# Patient Record
Sex: Male | Born: 1990 | Race: White | Hispanic: No | Marital: Single | Smoking: Current every day smoker
Health system: Southern US, Community
[De-identification: ages and names within clinical notes are randomized; demographics above are authoritative.]

## PROBLEM LIST (undated history)

## (undated) DIAGNOSIS — F1910 Other psychoactive substance abuse, uncomplicated: Secondary | ICD-10-CM

## (undated) DIAGNOSIS — H44009 Unspecified purulent endophthalmitis, unspecified eye: Secondary | ICD-10-CM

## (undated) DIAGNOSIS — K7460 Unspecified cirrhosis of liver: Secondary | ICD-10-CM

## (undated) DIAGNOSIS — K219 Gastro-esophageal reflux disease without esophagitis: Secondary | ICD-10-CM

## (undated) DIAGNOSIS — B1920 Unspecified viral hepatitis C without hepatic coma: Secondary | ICD-10-CM

## (undated) HISTORY — DX: Other psychoactive substance abuse, uncomplicated: F19.10

## (undated) HISTORY — DX: Gastro-esophageal reflux disease without esophagitis: K21.9

## (undated) HISTORY — DX: Unspecified purulent endophthalmitis, unspecified eye: H44.009

## (undated) HISTORY — DX: Unspecified viral hepatitis C without hepatic coma: B19.20

## (undated) HISTORY — DX: Unspecified cirrhosis of liver: K74.60

---

## 2018-05-31 DIAGNOSIS — K769 Liver disease, unspecified: Secondary | ICD-10-CM | POA: Diagnosis not present

## 2018-05-31 DIAGNOSIS — D649 Anemia, unspecified: Secondary | ICD-10-CM | POA: Diagnosis not present

## 2018-05-31 DIAGNOSIS — R040 Epistaxis: Secondary | ICD-10-CM | POA: Diagnosis not present

## 2018-05-31 DIAGNOSIS — R600 Localized edema: Secondary | ICD-10-CM | POA: Diagnosis not present

## 2018-05-31 DIAGNOSIS — R188 Other ascites: Secondary | ICD-10-CM | POA: Diagnosis not present

## 2018-06-15 DIAGNOSIS — M25562 Pain in left knee: Secondary | ICD-10-CM | POA: Diagnosis not present

## 2018-06-15 DIAGNOSIS — M25362 Other instability, left knee: Secondary | ICD-10-CM | POA: Diagnosis not present

## 2019-01-18 DIAGNOSIS — K219 Gastro-esophageal reflux disease without esophagitis: Secondary | ICD-10-CM | POA: Diagnosis not present

## 2019-01-18 DIAGNOSIS — K769 Liver disease, unspecified: Secondary | ICD-10-CM | POA: Diagnosis not present

## 2019-01-18 DIAGNOSIS — B182 Chronic viral hepatitis C: Secondary | ICD-10-CM | POA: Diagnosis not present

## 2019-01-18 DIAGNOSIS — Z6824 Body mass index (BMI) 24.0-24.9, adult: Secondary | ICD-10-CM | POA: Diagnosis not present

## 2019-01-26 HISTORY — PX: EYE SURGERY: SHX253

## 2019-01-27 DIAGNOSIS — H4389 Other disorders of vitreous body: Secondary | ICD-10-CM | POA: Diagnosis not present

## 2019-01-27 DIAGNOSIS — Z79899 Other long term (current) drug therapy: Secondary | ICD-10-CM | POA: Diagnosis not present

## 2019-01-27 DIAGNOSIS — H44002 Unspecified purulent endophthalmitis, left eye: Secondary | ICD-10-CM | POA: Diagnosis not present

## 2019-01-27 DIAGNOSIS — F17210 Nicotine dependence, cigarettes, uncomplicated: Secondary | ICD-10-CM | POA: Diagnosis not present

## 2019-01-27 DIAGNOSIS — F1123 Opioid dependence with withdrawal: Secondary | ICD-10-CM | POA: Diagnosis not present

## 2019-01-27 DIAGNOSIS — F1020 Alcohol dependence, uncomplicated: Secondary | ICD-10-CM | POA: Diagnosis not present

## 2019-01-27 DIAGNOSIS — H35352 Cystoid macular degeneration, left eye: Secondary | ICD-10-CM | POA: Diagnosis not present

## 2019-01-27 DIAGNOSIS — H5462 Unqualified visual loss, left eye, normal vision right eye: Secondary | ICD-10-CM | POA: Diagnosis not present

## 2019-01-27 DIAGNOSIS — J45909 Unspecified asthma, uncomplicated: Secondary | ICD-10-CM | POA: Diagnosis not present

## 2019-01-27 DIAGNOSIS — B182 Chronic viral hepatitis C: Secondary | ICD-10-CM | POA: Diagnosis not present

## 2019-01-27 DIAGNOSIS — Z8614 Personal history of Methicillin resistant Staphylococcus aureus infection: Secondary | ICD-10-CM | POA: Diagnosis not present

## 2019-01-27 DIAGNOSIS — F1990 Other psychoactive substance use, unspecified, uncomplicated: Secondary | ICD-10-CM | POA: Diagnosis not present

## 2019-01-27 DIAGNOSIS — K7030 Alcoholic cirrhosis of liver without ascites: Secondary | ICD-10-CM | POA: Diagnosis not present

## 2019-01-27 DIAGNOSIS — B1920 Unspecified viral hepatitis C without hepatic coma: Secondary | ICD-10-CM | POA: Diagnosis not present

## 2019-01-27 DIAGNOSIS — K219 Gastro-esophageal reflux disease without esophagitis: Secondary | ICD-10-CM | POA: Diagnosis not present

## 2019-01-27 DIAGNOSIS — F19130 Other psychoactive substance abuse with withdrawal, uncomplicated: Secondary | ICD-10-CM | POA: Diagnosis not present

## 2019-01-27 DIAGNOSIS — F419 Anxiety disorder, unspecified: Secondary | ICD-10-CM | POA: Diagnosis not present

## 2019-01-27 DIAGNOSIS — H4419 Other endophthalmitis: Secondary | ICD-10-CM | POA: Diagnosis not present

## 2019-01-27 DIAGNOSIS — H538 Other visual disturbances: Secondary | ICD-10-CM | POA: Diagnosis not present

## 2019-01-27 DIAGNOSIS — F1910 Other psychoactive substance abuse, uncomplicated: Secondary | ICD-10-CM | POA: Diagnosis not present

## 2019-01-28 DIAGNOSIS — F17210 Nicotine dependence, cigarettes, uncomplicated: Secondary | ICD-10-CM | POA: Diagnosis not present

## 2019-01-28 DIAGNOSIS — K7030 Alcoholic cirrhosis of liver without ascites: Secondary | ICD-10-CM | POA: Diagnosis not present

## 2019-01-28 DIAGNOSIS — K219 Gastro-esophageal reflux disease without esophagitis: Secondary | ICD-10-CM | POA: Diagnosis not present

## 2019-01-28 DIAGNOSIS — B1920 Unspecified viral hepatitis C without hepatic coma: Secondary | ICD-10-CM | POA: Diagnosis not present

## 2019-01-28 DIAGNOSIS — F1021 Alcohol dependence, in remission: Secondary | ICD-10-CM | POA: Diagnosis not present

## 2019-01-28 DIAGNOSIS — F1110 Opioid abuse, uncomplicated: Secondary | ICD-10-CM | POA: Diagnosis not present

## 2019-01-28 DIAGNOSIS — F419 Anxiety disorder, unspecified: Secondary | ICD-10-CM | POA: Diagnosis not present

## 2019-01-28 DIAGNOSIS — F1120 Opioid dependence, uncomplicated: Secondary | ICD-10-CM | POA: Diagnosis not present

## 2019-01-28 DIAGNOSIS — H44002 Unspecified purulent endophthalmitis, left eye: Secondary | ICD-10-CM | POA: Diagnosis not present

## 2019-01-28 DIAGNOSIS — B179 Acute viral hepatitis, unspecified: Secondary | ICD-10-CM | POA: Diagnosis not present

## 2019-02-03 DIAGNOSIS — Z6823 Body mass index (BMI) 23.0-23.9, adult: Secondary | ICD-10-CM | POA: Diagnosis not present

## 2019-02-03 DIAGNOSIS — H4419 Other endophthalmitis: Secondary | ICD-10-CM | POA: Diagnosis not present

## 2019-02-03 DIAGNOSIS — Z792 Long term (current) use of antibiotics: Secondary | ICD-10-CM | POA: Diagnosis not present

## 2019-02-07 DIAGNOSIS — Z792 Long term (current) use of antibiotics: Secondary | ICD-10-CM | POA: Diagnosis not present

## 2019-02-10 DIAGNOSIS — B3789 Other sites of candidiasis: Secondary | ICD-10-CM | POA: Diagnosis not present

## 2019-02-10 DIAGNOSIS — T3695XA Adverse effect of unspecified systemic antibiotic, initial encounter: Secondary | ICD-10-CM | POA: Diagnosis not present

## 2019-02-10 DIAGNOSIS — R0602 Shortness of breath: Secondary | ICD-10-CM | POA: Diagnosis not present

## 2019-02-10 DIAGNOSIS — E876 Hypokalemia: Secondary | ICD-10-CM | POA: Diagnosis not present

## 2019-02-10 DIAGNOSIS — H4419 Other endophthalmitis: Secondary | ICD-10-CM | POA: Diagnosis not present

## 2019-02-10 DIAGNOSIS — F1020 Alcohol dependence, uncomplicated: Secondary | ICD-10-CM | POA: Diagnosis not present

## 2019-02-10 DIAGNOSIS — B49 Unspecified mycosis: Secondary | ICD-10-CM | POA: Diagnosis not present

## 2019-02-10 DIAGNOSIS — Z1639 Resistance to other specified antimicrobial drug: Secondary | ICD-10-CM | POA: Diagnosis not present

## 2019-02-10 DIAGNOSIS — Z5329 Procedure and treatment not carried out because of patient's decision for other reasons: Secondary | ICD-10-CM | POA: Diagnosis not present

## 2019-02-10 DIAGNOSIS — F1123 Opioid dependence with withdrawal: Secondary | ICD-10-CM | POA: Diagnosis not present

## 2019-02-10 DIAGNOSIS — F419 Anxiety disorder, unspecified: Secondary | ICD-10-CM | POA: Diagnosis not present

## 2019-02-10 DIAGNOSIS — R0600 Dyspnea, unspecified: Secondary | ICD-10-CM | POA: Diagnosis not present

## 2019-02-10 DIAGNOSIS — B179 Acute viral hepatitis, unspecified: Secondary | ICD-10-CM | POA: Diagnosis not present

## 2019-02-10 DIAGNOSIS — N179 Acute kidney failure, unspecified: Secondary | ICD-10-CM | POA: Diagnosis not present

## 2019-02-10 DIAGNOSIS — B182 Chronic viral hepatitis C: Secondary | ICD-10-CM | POA: Diagnosis not present

## 2019-02-10 DIAGNOSIS — B379 Candidiasis, unspecified: Secondary | ICD-10-CM | POA: Diagnosis not present

## 2019-02-10 DIAGNOSIS — F1910 Other psychoactive substance abuse, uncomplicated: Secondary | ICD-10-CM | POA: Diagnosis not present

## 2019-02-10 DIAGNOSIS — F13239 Sedative, hypnotic or anxiolytic dependence with withdrawal, unspecified: Secondary | ICD-10-CM | POA: Diagnosis not present

## 2019-02-10 DIAGNOSIS — F17210 Nicotine dependence, cigarettes, uncomplicated: Secondary | ICD-10-CM | POA: Diagnosis not present

## 2019-02-10 DIAGNOSIS — H5789 Other specified disorders of eye and adnexa: Secondary | ICD-10-CM | POA: Diagnosis not present

## 2019-02-10 DIAGNOSIS — H44002 Unspecified purulent endophthalmitis, left eye: Secondary | ICD-10-CM | POA: Diagnosis not present

## 2019-02-10 DIAGNOSIS — E871 Hypo-osmolality and hyponatremia: Secondary | ICD-10-CM | POA: Diagnosis not present

## 2019-02-10 DIAGNOSIS — K219 Gastro-esophageal reflux disease without esophagitis: Secondary | ICD-10-CM | POA: Diagnosis not present

## 2019-02-10 DIAGNOSIS — K7030 Alcoholic cirrhosis of liver without ascites: Secondary | ICD-10-CM | POA: Diagnosis not present

## 2019-07-04 ENCOUNTER — Encounter: Payer: Self-pay | Admitting: Family

## 2019-07-10 ENCOUNTER — Other Ambulatory Visit: Payer: Self-pay

## 2019-07-10 ENCOUNTER — Encounter: Payer: BC Managed Care – PPO | Attending: Physician Assistant | Admitting: Physician Assistant

## 2019-07-10 DIAGNOSIS — K7460 Unspecified cirrhosis of liver: Secondary | ICD-10-CM | POA: Diagnosis not present

## 2019-07-10 DIAGNOSIS — T22212A Burn of second degree of left forearm, initial encounter: Secondary | ICD-10-CM | POA: Insufficient documentation

## 2019-07-10 DIAGNOSIS — B182 Chronic viral hepatitis C: Secondary | ICD-10-CM | POA: Diagnosis not present

## 2019-07-10 DIAGNOSIS — X58XXXA Exposure to other specified factors, initial encounter: Secondary | ICD-10-CM | POA: Insufficient documentation
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
## 2019-07-17 ENCOUNTER — Encounter: Payer: BC Managed Care – PPO | Admitting: Physician Assistant

## 2019-07-17 ENCOUNTER — Other Ambulatory Visit: Payer: Self-pay

## 2019-07-17 DIAGNOSIS — K7460 Unspecified cirrhosis of liver: Secondary | ICD-10-CM | POA: Diagnosis not present

## 2019-07-17 DIAGNOSIS — T22212A Burn of second degree of left forearm, initial encounter: Secondary | ICD-10-CM | POA: Diagnosis not present

## 2019-07-17 DIAGNOSIS — B182 Chronic viral hepatitis C: Secondary | ICD-10-CM | POA: Diagnosis not present

## 2019-07-17 DIAGNOSIS — X58XXXA Exposure to other specified factors, initial encounter: Secondary | ICD-10-CM | POA: Diagnosis not present
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
## 2019-07-24 ENCOUNTER — Encounter: Payer: BC Managed Care – PPO | Admitting: Physician Assistant

## 2019-07-24 ENCOUNTER — Other Ambulatory Visit: Payer: Self-pay

## 2019-07-24 DIAGNOSIS — X58XXXA Exposure to other specified factors, initial encounter: Secondary | ICD-10-CM | POA: Diagnosis not present

## 2019-07-24 DIAGNOSIS — K7460 Unspecified cirrhosis of liver: Secondary | ICD-10-CM | POA: Diagnosis not present

## 2019-07-24 DIAGNOSIS — T22212A Burn of second degree of left forearm, initial encounter: Secondary | ICD-10-CM | POA: Diagnosis not present

## 2019-07-24 DIAGNOSIS — B182 Chronic viral hepatitis C: Secondary | ICD-10-CM | POA: Diagnosis not present
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
## 2019-07-31 ENCOUNTER — Ambulatory Visit: Payer: BC Managed Care – PPO | Admitting: Physician Assistant

## 2019-08-17 ENCOUNTER — Telehealth: Payer: Self-pay | Admitting: Pharmacy Technician

## 2019-08-17 NOTE — Telephone Encounter (Signed)
[facility] Encounter  Insurance verification completed.    The patient is insured through [employer].  We will continue to follow to see if copay assistance is needed.  Daylynn Stumpp E. Chameka Mcmullen, CPhT [facility] Phone: [phone] Fax:  [phone]   

## 2019-08-23 ENCOUNTER — Encounter: Payer: Self-pay | Admitting: Family

## 2019-08-23 ENCOUNTER — Ambulatory Visit (INDEPENDENT_AMBULATORY_CARE_PROVIDER_SITE_OTHER): Payer: BC Managed Care – PPO | Admitting: Family

## 2019-08-23 ENCOUNTER — Other Ambulatory Visit: Payer: Self-pay

## 2019-08-23 VITALS — BP 148/78 | HR 96 | Temp 98.3°F | Ht 72.0 in | Wt 209.0 lb

## 2019-08-23 DIAGNOSIS — K7030 Alcoholic cirrhosis of liver without ascites: Secondary | ICD-10-CM | POA: Diagnosis not present

## 2019-08-23 DIAGNOSIS — B182 Chronic viral hepatitis C: Secondary | ICD-10-CM | POA: Diagnosis not present

## 2019-08-23 NOTE — Assessment & Plan Note (Signed)
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  Most recent RNA level of 289,000.  HIV and hepatitis B testing were negative.  He is currently asymptomatic and treatment nave.  We discussed the pathogenesis, risks of left untreated, transmission, prevention, and treatment options for hepatitis C.  Check hepatitis C genotype and liver function tests today.  He does have history of cirrhosis and will obtain elastography. He met with pharmacy staff today for financial assistance paperwork. Plan for follow-up 1 month after initiation of medication likely to be [place].

## 2019-08-23 NOTE — Patient Instructions (Signed)
Nice to meet you.  We will check your blood work today and call you with the results.  Plan for follow up 1 month after starting the medication.   Recommend establishing with a Gastroenterologist for follow up.   Limit acetaminophen (Tylenol) usage to no more than 2 grams (2,000 mg) per day.  Avoid alcohol.  Do not share toothbrushes or razors.  Practice safe sex to protect against transmission as well as sexually transmitted disease.    Hepatitis C Hepatitis C is a viral infection of the liver. It can lead to scarring of the liver (cirrhosis), liver failure, or liver cancer. Hepatitis C may go undetected for months or years because people with the infection may not have symptoms, or they may have only mild symptoms. What are the causes? This condition is caused by the hepatitis C virus (HCV). The virus can spread from person to person (is contagious) through:  Blood.  Childbirth. A woman who has hepatitis C can pass it to her baby during birth.  Bodily fluids, such as breast milk, tears, semen, vaginal fluids, and saliva.  Blood transfusions or organ transplants done in the [place] before 1992.  What increases the risk? The following factors may make you more likely to develop this condition:  Having contact with unclean (contaminated) needles or syringes. This may result from: ? Acupuncture. ? Tattoing. ? Body piercing. ? Injecting drugs.  Having unprotected sex with someone who is infected.  Needing treatment to filter your blood (kidney dialysis).  Having HIV (human immunodeficiency virus) or AIDS (acquired immunodeficiency syndrome).  Working in a job that involves contact with blood or bodily fluids, such as health care.  What are the signs or symptoms? Symptoms of this condition include:  Fatigue.  Loss of appetite.  Nausea.  Vomiting.  Abdominal pain.  Dark yellow urine.  Yellowish skin and eyes (jaundice).  Itchy skin.  Clay-colored  bowel movements.  Joint pain.  Bleeding and bruising easily.  Fluid building up in your stomach (ascites).  In some cases, you may not have any symptoms. How is this diagnosed? This condition is diagnosed with:  Blood tests.  Other tests to check how well your liver is functioning. They may include: ? Magnetic resonance elastography (MRE). This imaging test uses MRIs and sound waves to measure liver stiffness. ? Transient elastography. This imaging test uses ultrasounds to measure liver stiffness. ? Liver biopsy. This test requires taking a small tissue sample from your liver to examine it under a microscope.  How is this treated? Your health care provider may perform noninvasive tests or a liver biopsy to help decide the best course of treatment. Treatment may include:  Antiviral medicines and other medicines.  Follow-up treatments every 6-12 months for infections or other liver conditions.  Receiving a donated liver (liver transplant).  Follow these instructions at home: Medicines  Take over-the-counter and prescription medicines only as told by your health care provider.  Take your antiviral medicine as told by your health care provider. Do not stop taking the antiviral even if you start to feel better.  Do not take any medicines unless approved by your health care provider, including over-the-counter medicines and birth control pills. Activity  Rest as needed.  Do not have sex unless approved by your health care provider.  Ask your health care provider when you may return to school or work. Eating and drinking  Eat a balanced diet with plenty of fruits and vegetables, whole grains, and lowfat (lean)  meats or non-meat proteins (such as beans or tofu).  Drink enough fluids to keep your urine clear or pale yellow.  Do not drink alcohol. General instructions  Do not share toothbrushes, nail clippers, or razors.  Wash your hands frequently with soap and water. If  soap and water are not available, use hand sanitizer.  Cover any cuts or open sores on your skin to prevent spreading the virus.  Keep all follow-up visits as told by your health care provider. This is important. You may need follow-up visits every 6-12 months. How is this prevented? There is no vaccine for hepatitis C. The only way to prevent the disease is to reduce the risk of exposure to the virus. Make sure you:  Wash your hands frequently with soap and water. If soap and water are not available, use hand sanitizer.  Do not share needles or syringes.  Practice safe sex and use condoms.  Avoid handling blood or bodily fluids without gloves or other protection.  Avoid getting tattoos or piercings in shops or other locations that are not clean.  Contact a health care provider if:  You have a fever.  You develop abdominal pain.  You pass dark urine.  You pass clay-colored stools.  You develop joint pain. Get help right away if:  You have increasing fatigue or weakness.  You lose your appetite.  You cannot eat or drink without vomiting.  You develop jaundice or your jaundice gets worse.  You bruise or bleed easily. Summary  Hepatitis C is a viral infection of the liver. It can lead to scarring of the liver (cirrhosis), liver failure, or liver cancer.  The hepatitis C virus (HCV) causes this condition. The virus can pass from person to person (is contagious).  You should not take any medicines unless approved by your health care provider. This includes over-the-counter medicines and birth control pills. This information is not intended to replace advice given to you by your health care provider. Make sure you discuss any questions you have with your health care provider. Document Released: 04/10/2000 Document Revised: 05/19/2016 Document Reviewed: 05/19/2016 Elsevier Interactive Patient Education  [employer].

## 2019-08-23 NOTE — Progress Notes (Signed)
Subjective:    Patient ID: [name] [name], male    DOB: 1991-02-08, 29 y.o.   MRN: [id number]  Chief Complaint  Patient presents with  . New Patient (Initial Visit)  . Hepatitis C    HPI:  [name] [name] is a 29 y.o. male with previous medical history of endophthalmitis, gastroesophageal reflux, cirrhosis, alcohol abuse, history of intravenous drug use and hepatitis C presenting today for history initial treatment and evaluation of hepatitis C.  [name] [name] was referred from [facility] where is currently in treatment.  Blood work completed on 07/04/2019 with positive Hepatitis C antibody, RNA level of 289,000, Platlet count of 125, and liver function tests with AST 70 and ALT 89. HIV and Hepatitis B testing was negative.   [name] [name] was first diagnosed with hepatitis C in 2012 when he was seeing an addiction specialist for Suboxone.  Risk factors for acquiring hepatitis C include IV drug use and tattoos.  Denies history of blood transfusions, sharing of toothbrushes/razors, or sexual contact with known positive partners.  Personal history of cirrhosis as well as family history of cirrhosis secondary to alcohol and hepatitis C in father.  Currently asymptomatic with no abdominal pain, nausea, vomiting, scleral icterus, or jaundice.  He has not been treated for hepatitis C in the past.  No current recreational or illicit drug use or alcohol consumption.  He smokes approximately 1 pack of cigarettes per day on average.  Continues to be at rehabilitation with [facility].    No Known Allergies    Outpatient Medications Prior to Visit  Medication Sig Dispense Refill  . albuterol (VENTOLIN HFA) 108 (90 Base) MCG/ACT inhaler     . divalproex (DEPAKOTE ER) 500 MG 24 hr tablet Take 500 mg by mouth 2 (two) times daily.    . Multiple Vitamins-Minerals (MENS MULTIVITAMIN) TABS Take by mouth.    . pantoprazole (PROTONIX) 40 MG tablet Take 40 mg by mouth daily.    . QUEtiapine (SEROQUEL) 50 MG  tablet SMARTSIG:1 Tablet(s) By Mouth Every Evening PRN     No facility-administered medications prior to visit.     Past Medical History:  Diagnosis Date  . Cirrhosis ([place])   . Endophthalmitis   . GERD (gastroesophageal reflux disease)   . Hepatitis C   . Substance abuse [facility])       Past Surgical History:  Procedure Laterality Date  . EYE SURGERY Left 01/26/2019      Family History  Problem Relation Age of Onset  . Diabetes Father       Social History   Socioeconomic History  . Marital status: Single    Spouse name: Not on file  . Number of children: Not on file  . Years of education: Not on file  . Highest education level: Not on file  Occupational History  . Not on file  Tobacco Use  . Smoking status: Current Every Day Smoker    Packs/day: 1.00    Types: Cigarettes    Start date: 08/22/2005  . Smokeless tobacco: Never Used  . Tobacco comment: cut back from 2 packs a day  Substance and Sexual Activity  . Alcohol use: Not Currently    Comment: none since 2020  . Drug use: Not Currently    Types: IV, Heroin, Marijuana, Cocaine, Barbituates, Benzodiazepines, "Crack" cocaine, Codeine, Fentanyl, Hashish, Hydrocodone, Hydromorphone, Ketamine, LSD, MDMA (Ecstacy), Morphine, Nitrous oxide, Opium, Oxycodone, Psilocybin, Solvent inhalants    Comment: started at 14; clean since 06/25/19, in [employer]  .  Sexual activity: Not Currently    Partners: Female    Comment: declined condoms  Other Topics Concern  . Not on file  Social History Narrative  . Not on file   Social Determinants of Health   Financial Resource Strain:   . Difficulty of Paying Living Expenses:   Food Insecurity:   . Worried About Charity fundraiser in the Last Year:   . Arboriculturist in the Last Year:   Transportation Needs:   . Film/video editor (Medical):   Marland Kitchen Lack of Transportation (Non-Medical):   Physical Activity:   . Days of Exercise per Week:   . Minutes of Exercise  per Session:   Stress:   . Feeling of Stress :   Social Connections:   . Frequency of Communication with Friends and Family:   . Frequency of Social Gatherings with Friends and Family:   . Attends Religious Services:   . Active Member of Clubs or Organizations:   . Attends Archivist Meetings:   Marland Kitchen Marital Status:   Intimate Partner Violence:   . Fear of Current or Ex-Partner:   . Emotionally Abused:   Marland Kitchen Physically Abused:   . Sexually Abused:       Review of Systems  Constitutional: Negative for chills, diaphoresis, fatigue and fever.  Respiratory: Negative for cough, chest tightness, shortness of breath and wheezing.   Cardiovascular: Negative for chest pain.  Gastrointestinal: Negative for abdominal distention, abdominal pain, constipation, diarrhea, nausea and vomiting.  Neurological: Negative for weakness and headaches.  Hematological: Does not bruise/bleed easily.       Objective:    BP (!) 148/78   Pulse 96   Temp 98.3 F (36.8 C) (Oral)   Ht 6' (1.829 m)   Wt 209 lb (94.8 kg)   SpO2 98%   BMI 28.35 kg/m  Nursing note and vital signs reviewed.  Physical Exam Constitutional:      General: He is not in acute distress.    Appearance: He is well-developed.  Cardiovascular:     Rate and Rhythm: Normal rate and regular rhythm.     Heart sounds: Normal heart sounds. No murmur. No friction rub. No gallop.   Pulmonary:     Effort: Pulmonary effort is normal. No respiratory distress.     Breath sounds: Normal breath sounds. No wheezing or rales.  Chest:     Chest wall: No tenderness.  Abdominal:     General: Bowel sounds are normal. There is no distension.     Palpations: Abdomen is soft. There is no mass.     Tenderness: There is no abdominal tenderness. There is no guarding or rebound.  Skin:    General: Skin is warm and dry.  Neurological:     Mental Status: He is alert and oriented to person, place, and time.  Psychiatric:        Behavior:  Behavior normal.        Thought Content: Thought content normal.        Judgment: Judgment normal.        Assessment & Plan:   Patient Active Problem List   Diagnosis Date Noted  . Chronic hepatitis C without hepatic coma ([place]) 08/23/2019  . Alcoholic cirrhosis of liver without ascites ([place]) 08/23/2019     Problem List Items Addressed This Visit      Digestive   Chronic hepatitis C without hepatic coma ([place]) - Primary    [name] [name] is a  29 year old male with chronic hepatitis C likely obtained from history of IV drug use.  Most recent RNA level of 289,000.  HIV and hepatitis B testing were negative.  He is currently asymptomatic and treatment nave.  We discussed the pathogenesis, risks of left untreated, transmission, prevention, and treatment options for hepatitis C.  Check hepatitis C genotype and liver function tests today.  He does have history of cirrhosis and will obtain elastography. He met with pharmacy staff today for financial assistance paperwork. Plan for follow-up 1 month after initiation of medication likely to be Mavyret.        Relevant Orders   Hepatic function panel   Hepatitis C genotype   Protime-INR   CBC   US ABDOMEN COMPLETE W/ELASTOGRAPHY   Alcoholic cirrhosis of liver without ascites [facility])    [name] [name] is a 29 year old male with cirrhosis of the liver secondary to alcohol use and chronic hepatitis C.  Appears to be compensated at present we will check liver function tests today and schedule elastography.  Recommended to follow-up with gastroenterology for routine care.  Will need continued follow-up at completion of treatment for hepatitis C.      Relevant Orders   Hepatic function panel   Protime-INR   CBC   US ABDOMEN COMPLETE W/ELASTOGRAPHY       I am having [name] maintain his albuterol, divalproex, Mens Multivitamin, pantoprazole, and QUEtiapine.   Follow-up: Pending blood work results.   Terri Piedra, MSN, FNP-C Nurse  Practitioner [facility] for Infectious Disease [place] number: [phone]

## 2019-08-23 NOTE — Assessment & Plan Note (Signed)
[name] [name] is a 29 year old male with cirrhosis of the liver secondary to alcohol use and chronic hepatitis C.  Appears to be compensated at present we will check liver function tests today and schedule elastography.  Recommended to follow-up with gastroenterology for routine care.  Will need continued follow-up at completion of treatment for hepatitis C.

## 2019-08-29 LAB — CBC
HCT: 38.8 % (ref 38.5–50.0)
Hemoglobin: 13 g/dL — ABNORMAL LOW (ref 13.2–17.1)
MCH: 30 pg (ref 27.0–33.0)
MCHC: 33.5 g/dL (ref 32.0–36.0)
MCV: 89.6 fL (ref 80.0–100.0)
MPV: 11.5 fL (ref 7.5–12.5)
Platelets: 113 10*3/uL — ABNORMAL LOW (ref 140–400)
RBC: 4.33 10*6/uL (ref 4.20–5.80)
RDW: 13.1 % (ref 11.0–15.0)
WBC: 7.7 10*3/uL (ref 3.8–10.8)

## 2019-08-29 LAB — HEPATIC FUNCTION PANEL
AG Ratio: 1.6 (calc) (ref 1.0–2.5)
ALT: 77 U/L — ABNORMAL HIGH (ref 9–46)
AST: 42 U/L — ABNORMAL HIGH (ref 10–40)
Albumin: 4.7 g/dL (ref 3.6–5.1)
Alkaline phosphatase (APISO): 76 U/L (ref 36–130)
Bilirubin, Direct: 0.1 mg/dL (ref 0.0–0.2)
Globulin: 2.9 g/dL (calc) (ref 1.9–3.7)
Indirect Bilirubin: 0.3 mg/dL (calc) (ref 0.2–1.2)
Total Bilirubin: 0.4 mg/dL (ref 0.2–1.2)
Total Protein: 7.6 g/dL (ref 6.1–8.1)

## 2019-08-29 LAB — PROTIME-INR
INR: 1.1
Prothrombin Time: 11.3 s (ref 9.0–11.5)

## 2019-08-29 LAB — HEPATITIS C GENOTYPE

## 2019-08-30 ENCOUNTER — Other Ambulatory Visit: Payer: Self-pay

## 2019-08-30 ENCOUNTER — Ambulatory Visit (HOSPITAL_COMMUNITY)
Admission: RE | Admit: 2019-08-30 | Discharge: 2019-08-30 | Disposition: A | Discharge status: Home / Self Care | Payer: BC Managed Care – PPO | Source: Ambulatory Visit | Attending: Family | Admitting: Family

## 2019-08-30 ENCOUNTER — Telehealth: Payer: Self-pay | Admitting: Family

## 2019-08-30 DIAGNOSIS — K7689 Other specified diseases of liver: Secondary | ICD-10-CM | POA: Diagnosis not present

## 2019-08-30 DIAGNOSIS — B182 Chronic viral hepatitis C: Secondary | ICD-10-CM

## 2019-08-30 DIAGNOSIS — B1920 Unspecified viral hepatitis C without hepatic coma: Secondary | ICD-10-CM | POA: Diagnosis not present

## 2019-08-30 DIAGNOSIS — K7030 Alcoholic cirrhosis of liver without ascites: Secondary | ICD-10-CM | POA: Insufficient documentation

## 2019-08-30 MED ORDER — MAVYRET 100-40 MG PO TABS: 3.0000 | ORAL_TABLET | Freq: Every day | ORAL | 1 refills | Status: AC

## 2019-08-30 NOTE — Telephone Encounter (Signed)
[name] [name] has Genotype 1a chronic hepatitis C with initial viral load of 289,000 and known compensated cirrhosis. Liver ultrasound without concern for hepatocellular carcinoma. Discussed these results with [name] [name] and will plan for 8 weeks of Mavyret. Follow up 1 month after start of medication.

## 2019-08-31 ENCOUNTER — Telehealth: Payer: Self-pay | Admitting: Pharmacy Technician

## 2019-08-31 NOTE — Telephone Encounter (Signed)
[facility] Patient Advocate Encounter   Received notification from [facility] that prior authorization for Mavyret is required.   PA submitted on 08/31/2019 Key [id number] Status is pending [phone]    [facility] will continue to follow.  Beulah Gandy, CPhT Specialty Pharmacy Patient [facility] for Infectious Disease Phone: [phone] Fax: [phone] 08/31/2019 12:08 PM

## 2019-08-31 NOTE — Telephone Encounter (Signed)
Will submit the prior authorization and contact the patient.

## 2019-09-04 NOTE — Telephone Encounter (Signed)
[facility] Patient Advocate Encounter  Prior Review and certification form was filled out and faxed back to [employer] [place]. The form was sent along with baseline hcv rna, office notes, ultrasound and reasons why the patient cannot take the preferred Velpatasvir/Sofosbuvir.   Fax: [phone]  Pending clinical review now that baseline RNA has been submitted

## 2019-09-04 NOTE — Telephone Encounter (Signed)
[facility] Patient Advocate Encounter  Received notification from [employer] that the request for prior authorization for Mavyret has been denied due to not having a required documented HCV RNA baseline. We have it in the office notes but not the lab result version. [facility].     This determination can be appealed and faxed in once we have the required documentation. It was denied on 09/03/2019   This encounter will continue to be updated until final determination.    Beulah Gandy, CPhT Specialty Pharmacy Patient [facility] for Infectious Disease Phone: [phone] Fax: [phone] 09/04/2019 11:45 AM

## 2019-09-11 MED FILL — MAVYRET 100-40 MG TABS: 100-40 | 28 days supply | Qty: 84 | Fill #0

## 2019-09-11 NOTE — Telephone Encounter (Addendum)
[facility] Patient Advocate Encounter  Prior Authorization for Mavyret has been approved.    PA# [id number] Effective dates: 08/31/2019 through 02/27/2020  Patients co-pay is $0.00   [facility] will continue to follow. Left voicemail with the patient to confirm first shipment from [facility]  Beulah Gandy, CPhT Specialty Pharmacy Patient [facility] for Infectious Disease Phone: [phone] Fax: [phone] 09/11/2019 10:05 AM

## 2019-09-12 ENCOUNTER — Encounter: Payer: Self-pay | Admitting: Pharmacy Technician

## 2019-09-12 ENCOUNTER — Telehealth: Payer: Self-pay | Admitting: Pharmacist

## 2019-09-12 NOTE — Telephone Encounter (Signed)
Thanks [name] [name]! 

## 2019-10-13 MED FILL — MAVYRET 100-40 MG TABS: 100-40 | 28 days supply | Qty: 84 | Fill #1

## 2019-10-16 ENCOUNTER — Ambulatory Visit: Payer: BC Managed Care – PPO | Admitting: Pharmacist

## 2019-10-23 MED FILL — MAVYRET 100-40 MG TABS: 100-40 | 28 days supply | Qty: 84 | Fill #1

## 2019-11-13 ENCOUNTER — Ambulatory Visit: Payer: BC Managed Care – PPO | Admitting: Pharmacist

## 2020-01-14 DIAGNOSIS — K2900 Acute gastritis without bleeding: Secondary | ICD-10-CM | POA: Diagnosis not present

## 2020-01-14 DIAGNOSIS — F17200 Nicotine dependence, unspecified, uncomplicated: Secondary | ICD-10-CM | POA: Diagnosis not present

## 2020-01-14 DIAGNOSIS — K7460 Unspecified cirrhosis of liver: Secondary | ICD-10-CM | POA: Diagnosis not present

## 2020-01-14 DIAGNOSIS — K2920 Alcoholic gastritis without bleeding: Secondary | ICD-10-CM | POA: Diagnosis not present

## 2020-01-14 DIAGNOSIS — R1013 Epigastric pain: Secondary | ICD-10-CM | POA: Diagnosis not present

## 2020-03-10 DIAGNOSIS — F10239 Alcohol dependence with withdrawal, unspecified: Secondary | ICD-10-CM | POA: Diagnosis not present

## 2020-03-10 DIAGNOSIS — F17210 Nicotine dependence, cigarettes, uncomplicated: Secondary | ICD-10-CM | POA: Diagnosis not present

## 2020-03-10 DIAGNOSIS — J45909 Unspecified asthma, uncomplicated: Secondary | ICD-10-CM | POA: Diagnosis not present

## 2020-03-10 DIAGNOSIS — R1084 Generalized abdominal pain: Secondary | ICD-10-CM | POA: Diagnosis not present

## 2020-03-10 DIAGNOSIS — K7460 Unspecified cirrhosis of liver: Secondary | ICD-10-CM | POA: Diagnosis not present

## 2020-03-10 DIAGNOSIS — K7030 Alcoholic cirrhosis of liver without ascites: Secondary | ICD-10-CM | POA: Diagnosis not present

## 2020-03-10 DIAGNOSIS — B182 Chronic viral hepatitis C: Secondary | ICD-10-CM | POA: Diagnosis not present

## 2020-03-10 DIAGNOSIS — K7010 Alcoholic hepatitis without ascites: Secondary | ICD-10-CM | POA: Diagnosis not present

## 2020-03-10 DIAGNOSIS — Z8614 Personal history of Methicillin resistant Staphylococcus aureus infection: Secondary | ICD-10-CM | POA: Diagnosis not present

## 2020-03-10 DIAGNOSIS — Z79899 Other long term (current) drug therapy: Secondary | ICD-10-CM | POA: Diagnosis not present

## 2020-03-10 DIAGNOSIS — R109 Unspecified abdominal pain: Secondary | ICD-10-CM | POA: Diagnosis not present

## 2020-03-10 DIAGNOSIS — K219 Gastro-esophageal reflux disease without esophagitis: Secondary | ICD-10-CM | POA: Diagnosis not present

## 2020-03-10 DIAGNOSIS — B179 Acute viral hepatitis, unspecified: Secondary | ICD-10-CM | POA: Diagnosis not present

## 2020-03-10 DIAGNOSIS — K2920 Alcoholic gastritis without bleeding: Secondary | ICD-10-CM | POA: Diagnosis not present

## 2020-03-10 DIAGNOSIS — Z716 Tobacco abuse counseling: Secondary | ICD-10-CM | POA: Diagnosis not present

## 2020-03-26 DIAGNOSIS — F10239 Alcohol dependence with withdrawal, unspecified: Secondary | ICD-10-CM | POA: Diagnosis not present

## 2020-03-26 DIAGNOSIS — F1220 Cannabis dependence, uncomplicated: Secondary | ICD-10-CM | POA: Diagnosis not present

## 2020-03-26 DIAGNOSIS — F419 Anxiety disorder, unspecified: Secondary | ICD-10-CM | POA: Diagnosis not present

## 2020-03-26 DIAGNOSIS — F319 Bipolar disorder, unspecified: Secondary | ICD-10-CM | POA: Diagnosis not present

## 2020-03-26 DIAGNOSIS — F17200 Nicotine dependence, unspecified, uncomplicated: Secondary | ICD-10-CM | POA: Diagnosis not present

## 2020-03-26 DIAGNOSIS — F1020 Alcohol dependence, uncomplicated: Secondary | ICD-10-CM | POA: Diagnosis not present

## 2020-04-09 DIAGNOSIS — F419 Anxiety disorder, unspecified: Secondary | ICD-10-CM | POA: Diagnosis not present

## 2020-04-09 DIAGNOSIS — B182 Chronic viral hepatitis C: Secondary | ICD-10-CM | POA: Diagnosis not present

## 2020-04-09 DIAGNOSIS — K219 Gastro-esophageal reflux disease without esophagitis: Secondary | ICD-10-CM | POA: Diagnosis not present

## 2020-04-09 DIAGNOSIS — K7031 Alcoholic cirrhosis of liver with ascites: Secondary | ICD-10-CM | POA: Diagnosis not present

## 2020-05-01 DIAGNOSIS — B182 Chronic viral hepatitis C: Secondary | ICD-10-CM | POA: Diagnosis not present

## 2020-05-29 DIAGNOSIS — F1020 Alcohol dependence, uncomplicated: Secondary | ICD-10-CM | POA: Diagnosis not present

## 2020-05-29 DIAGNOSIS — F1120 Opioid dependence, uncomplicated: Secondary | ICD-10-CM | POA: Diagnosis not present

## 2020-05-29 DIAGNOSIS — F319 Bipolar disorder, unspecified: Secondary | ICD-10-CM | POA: Diagnosis not present

## 2020-06-11 DIAGNOSIS — F1020 Alcohol dependence, uncomplicated: Secondary | ICD-10-CM | POA: Diagnosis not present

## 2020-07-01 DIAGNOSIS — F1020 Alcohol dependence, uncomplicated: Secondary | ICD-10-CM | POA: Diagnosis not present

## 2020-07-01 DIAGNOSIS — F1120 Opioid dependence, uncomplicated: Secondary | ICD-10-CM | POA: Diagnosis not present

## 2020-07-01 DIAGNOSIS — F17200 Nicotine dependence, unspecified, uncomplicated: Secondary | ICD-10-CM | POA: Diagnosis not present

## 2020-07-01 DIAGNOSIS — F319 Bipolar disorder, unspecified: Secondary | ICD-10-CM | POA: Diagnosis not present

## 2020-07-08 DIAGNOSIS — F319 Bipolar disorder, unspecified: Secondary | ICD-10-CM | POA: Diagnosis not present

## 2020-09-03 DIAGNOSIS — F1020 Alcohol dependence, uncomplicated: Secondary | ICD-10-CM | POA: Diagnosis not present

## 2020-09-03 DIAGNOSIS — F1220 Cannabis dependence, uncomplicated: Secondary | ICD-10-CM | POA: Diagnosis not present

## 2020-09-03 DIAGNOSIS — F10239 Alcohol dependence with withdrawal, unspecified: Secondary | ICD-10-CM | POA: Diagnosis not present

## 2020-09-03 DIAGNOSIS — F319 Bipolar disorder, unspecified: Secondary | ICD-10-CM | POA: Diagnosis not present

## 2020-09-03 DIAGNOSIS — F419 Anxiety disorder, unspecified: Secondary | ICD-10-CM | POA: Diagnosis not present

## 2020-09-10 DIAGNOSIS — F319 Bipolar disorder, unspecified: Secondary | ICD-10-CM | POA: Diagnosis not present

## 2020-09-10 DIAGNOSIS — F1220 Cannabis dependence, uncomplicated: Secondary | ICD-10-CM | POA: Diagnosis not present

## 2020-09-10 DIAGNOSIS — F419 Anxiety disorder, unspecified: Secondary | ICD-10-CM | POA: Diagnosis not present

## 2020-09-10 DIAGNOSIS — F10239 Alcohol dependence with withdrawal, unspecified: Secondary | ICD-10-CM | POA: Diagnosis not present

## 2020-09-10 DIAGNOSIS — F1020 Alcohol dependence, uncomplicated: Secondary | ICD-10-CM | POA: Diagnosis not present

## 2020-09-24 DIAGNOSIS — F10239 Alcohol dependence with withdrawal, unspecified: Secondary | ICD-10-CM | POA: Diagnosis not present

## 2020-09-24 DIAGNOSIS — F419 Anxiety disorder, unspecified: Secondary | ICD-10-CM | POA: Diagnosis not present

## 2020-09-24 DIAGNOSIS — F319 Bipolar disorder, unspecified: Secondary | ICD-10-CM | POA: Diagnosis not present

## 2020-09-24 DIAGNOSIS — F1020 Alcohol dependence, uncomplicated: Secondary | ICD-10-CM | POA: Diagnosis not present

## 2020-09-24 DIAGNOSIS — F1220 Cannabis dependence, uncomplicated: Secondary | ICD-10-CM | POA: Diagnosis not present

## 2020-09-25 DIAGNOSIS — F10239 Alcohol dependence with withdrawal, unspecified: Secondary | ICD-10-CM | POA: Diagnosis not present

## 2020-09-25 DIAGNOSIS — F1220 Cannabis dependence, uncomplicated: Secondary | ICD-10-CM | POA: Diagnosis not present

## 2020-09-25 DIAGNOSIS — F1020 Alcohol dependence, uncomplicated: Secondary | ICD-10-CM | POA: Diagnosis not present

## 2020-09-25 DIAGNOSIS — F319 Bipolar disorder, unspecified: Secondary | ICD-10-CM | POA: Diagnosis not present

## 2020-09-25 DIAGNOSIS — F419 Anxiety disorder, unspecified: Secondary | ICD-10-CM | POA: Diagnosis not present

## 2020-09-26 DIAGNOSIS — F10239 Alcohol dependence with withdrawal, unspecified: Secondary | ICD-10-CM | POA: Diagnosis not present

## 2020-09-26 DIAGNOSIS — F1020 Alcohol dependence, uncomplicated: Secondary | ICD-10-CM | POA: Diagnosis not present

## 2020-09-26 DIAGNOSIS — F419 Anxiety disorder, unspecified: Secondary | ICD-10-CM | POA: Diagnosis not present

## 2020-09-26 DIAGNOSIS — F319 Bipolar disorder, unspecified: Secondary | ICD-10-CM | POA: Diagnosis not present

## 2020-09-26 DIAGNOSIS — F1220 Cannabis dependence, uncomplicated: Secondary | ICD-10-CM | POA: Diagnosis not present

## 2020-09-27 DIAGNOSIS — F419 Anxiety disorder, unspecified: Secondary | ICD-10-CM | POA: Diagnosis not present

## 2020-09-27 DIAGNOSIS — F10239 Alcohol dependence with withdrawal, unspecified: Secondary | ICD-10-CM | POA: Diagnosis not present

## 2020-09-27 DIAGNOSIS — F319 Bipolar disorder, unspecified: Secondary | ICD-10-CM | POA: Diagnosis not present

## 2020-09-27 DIAGNOSIS — F1220 Cannabis dependence, uncomplicated: Secondary | ICD-10-CM | POA: Diagnosis not present

## 2020-09-27 DIAGNOSIS — F1020 Alcohol dependence, uncomplicated: Secondary | ICD-10-CM | POA: Diagnosis not present

## 2020-09-28 DIAGNOSIS — F1220 Cannabis dependence, uncomplicated: Secondary | ICD-10-CM | POA: Diagnosis not present

## 2020-09-28 DIAGNOSIS — F419 Anxiety disorder, unspecified: Secondary | ICD-10-CM | POA: Diagnosis not present

## 2020-09-28 DIAGNOSIS — F1020 Alcohol dependence, uncomplicated: Secondary | ICD-10-CM | POA: Diagnosis not present

## 2020-09-28 DIAGNOSIS — F10239 Alcohol dependence with withdrawal, unspecified: Secondary | ICD-10-CM | POA: Diagnosis not present

## 2020-09-28 DIAGNOSIS — F319 Bipolar disorder, unspecified: Secondary | ICD-10-CM | POA: Diagnosis not present

## 2020-09-29 DIAGNOSIS — F419 Anxiety disorder, unspecified: Secondary | ICD-10-CM | POA: Diagnosis not present

## 2020-09-29 DIAGNOSIS — F10239 Alcohol dependence with withdrawal, unspecified: Secondary | ICD-10-CM | POA: Diagnosis not present

## 2020-09-29 DIAGNOSIS — F1220 Cannabis dependence, uncomplicated: Secondary | ICD-10-CM | POA: Diagnosis not present

## 2020-09-29 DIAGNOSIS — F1020 Alcohol dependence, uncomplicated: Secondary | ICD-10-CM | POA: Diagnosis not present

## 2020-09-29 DIAGNOSIS — F319 Bipolar disorder, unspecified: Secondary | ICD-10-CM | POA: Diagnosis not present

## 2020-09-30 DIAGNOSIS — F1020 Alcohol dependence, uncomplicated: Secondary | ICD-10-CM | POA: Diagnosis not present

## 2020-09-30 DIAGNOSIS — F1220 Cannabis dependence, uncomplicated: Secondary | ICD-10-CM | POA: Diagnosis not present

## 2020-09-30 DIAGNOSIS — F10239 Alcohol dependence with withdrawal, unspecified: Secondary | ICD-10-CM | POA: Diagnosis not present

## 2020-09-30 DIAGNOSIS — F319 Bipolar disorder, unspecified: Secondary | ICD-10-CM | POA: Diagnosis not present

## 2020-09-30 DIAGNOSIS — F419 Anxiety disorder, unspecified: Secondary | ICD-10-CM | POA: Diagnosis not present

## 2020-10-01 DIAGNOSIS — F1220 Cannabis dependence, uncomplicated: Secondary | ICD-10-CM | POA: Diagnosis not present

## 2020-10-01 DIAGNOSIS — F10239 Alcohol dependence with withdrawal, unspecified: Secondary | ICD-10-CM | POA: Diagnosis not present

## 2020-10-01 DIAGNOSIS — F1020 Alcohol dependence, uncomplicated: Secondary | ICD-10-CM | POA: Diagnosis not present

## 2020-10-01 DIAGNOSIS — F419 Anxiety disorder, unspecified: Secondary | ICD-10-CM | POA: Diagnosis not present

## 2020-10-01 DIAGNOSIS — F319 Bipolar disorder, unspecified: Secondary | ICD-10-CM | POA: Diagnosis not present

## 2020-10-02 DIAGNOSIS — F10239 Alcohol dependence with withdrawal, unspecified: Secondary | ICD-10-CM | POA: Diagnosis not present

## 2020-10-02 DIAGNOSIS — F1020 Alcohol dependence, uncomplicated: Secondary | ICD-10-CM | POA: Diagnosis not present

## 2020-10-02 DIAGNOSIS — F1220 Cannabis dependence, uncomplicated: Secondary | ICD-10-CM | POA: Diagnosis not present

## 2020-10-02 DIAGNOSIS — F419 Anxiety disorder, unspecified: Secondary | ICD-10-CM | POA: Diagnosis not present

## 2020-10-02 DIAGNOSIS — F319 Bipolar disorder, unspecified: Secondary | ICD-10-CM | POA: Diagnosis not present

## 2020-10-03 DIAGNOSIS — F319 Bipolar disorder, unspecified: Secondary | ICD-10-CM | POA: Diagnosis not present

## 2020-10-03 DIAGNOSIS — F1220 Cannabis dependence, uncomplicated: Secondary | ICD-10-CM | POA: Diagnosis not present

## 2020-10-03 DIAGNOSIS — F1020 Alcohol dependence, uncomplicated: Secondary | ICD-10-CM | POA: Diagnosis not present

## 2020-10-03 DIAGNOSIS — F419 Anxiety disorder, unspecified: Secondary | ICD-10-CM | POA: Diagnosis not present

## 2020-10-03 DIAGNOSIS — F10239 Alcohol dependence with withdrawal, unspecified: Secondary | ICD-10-CM | POA: Diagnosis not present

## 2020-10-04 DIAGNOSIS — F1220 Cannabis dependence, uncomplicated: Secondary | ICD-10-CM | POA: Diagnosis not present

## 2020-10-04 DIAGNOSIS — F10239 Alcohol dependence with withdrawal, unspecified: Secondary | ICD-10-CM | POA: Diagnosis not present

## 2020-10-04 DIAGNOSIS — F419 Anxiety disorder, unspecified: Secondary | ICD-10-CM | POA: Diagnosis not present

## 2020-10-04 DIAGNOSIS — F1020 Alcohol dependence, uncomplicated: Secondary | ICD-10-CM | POA: Diagnosis not present

## 2020-10-04 DIAGNOSIS — F319 Bipolar disorder, unspecified: Secondary | ICD-10-CM | POA: Diagnosis not present

## 2020-10-05 DIAGNOSIS — F10239 Alcohol dependence with withdrawal, unspecified: Secondary | ICD-10-CM | POA: Diagnosis not present

## 2020-10-05 DIAGNOSIS — F319 Bipolar disorder, unspecified: Secondary | ICD-10-CM | POA: Diagnosis not present

## 2020-10-05 DIAGNOSIS — F1220 Cannabis dependence, uncomplicated: Secondary | ICD-10-CM | POA: Diagnosis not present

## 2020-10-05 DIAGNOSIS — F419 Anxiety disorder, unspecified: Secondary | ICD-10-CM | POA: Diagnosis not present

## 2020-10-05 DIAGNOSIS — F1020 Alcohol dependence, uncomplicated: Secondary | ICD-10-CM | POA: Diagnosis not present

## 2020-10-06 DIAGNOSIS — F1020 Alcohol dependence, uncomplicated: Secondary | ICD-10-CM | POA: Diagnosis not present

## 2020-10-06 DIAGNOSIS — F1220 Cannabis dependence, uncomplicated: Secondary | ICD-10-CM | POA: Diagnosis not present

## 2020-10-06 DIAGNOSIS — F10239 Alcohol dependence with withdrawal, unspecified: Secondary | ICD-10-CM | POA: Diagnosis not present

## 2020-10-06 DIAGNOSIS — F319 Bipolar disorder, unspecified: Secondary | ICD-10-CM | POA: Diagnosis not present

## 2020-10-06 DIAGNOSIS — F419 Anxiety disorder, unspecified: Secondary | ICD-10-CM | POA: Diagnosis not present

## 2020-10-07 DIAGNOSIS — F319 Bipolar disorder, unspecified: Secondary | ICD-10-CM | POA: Diagnosis not present

## 2020-10-07 DIAGNOSIS — F1020 Alcohol dependence, uncomplicated: Secondary | ICD-10-CM | POA: Diagnosis not present

## 2020-10-07 DIAGNOSIS — F1220 Cannabis dependence, uncomplicated: Secondary | ICD-10-CM | POA: Diagnosis not present

## 2020-10-07 DIAGNOSIS — F419 Anxiety disorder, unspecified: Secondary | ICD-10-CM | POA: Diagnosis not present

## 2020-10-07 DIAGNOSIS — F10239 Alcohol dependence with withdrawal, unspecified: Secondary | ICD-10-CM | POA: Diagnosis not present

## 2020-10-09 DIAGNOSIS — F1020 Alcohol dependence, uncomplicated: Secondary | ICD-10-CM | POA: Diagnosis not present

## 2020-11-14 DIAGNOSIS — R1110 Vomiting, unspecified: Secondary | ICD-10-CM | POA: Diagnosis not present

## 2020-11-14 DIAGNOSIS — J45909 Unspecified asthma, uncomplicated: Secondary | ICD-10-CM | POA: Diagnosis not present

## 2020-11-14 DIAGNOSIS — R0602 Shortness of breath: Secondary | ICD-10-CM | POA: Diagnosis not present

## 2020-11-14 DIAGNOSIS — Z8616 Personal history of COVID-19: Secondary | ICD-10-CM | POA: Diagnosis not present

## 2020-11-14 DIAGNOSIS — R6883 Chills (without fever): Secondary | ICD-10-CM | POA: Diagnosis not present

## 2020-11-14 DIAGNOSIS — R531 Weakness: Secondary | ICD-10-CM | POA: Diagnosis not present

## 2020-11-14 DIAGNOSIS — R079 Chest pain, unspecified: Secondary | ICD-10-CM | POA: Diagnosis not present

## 2020-11-14 DIAGNOSIS — F17210 Nicotine dependence, cigarettes, uncomplicated: Secondary | ICD-10-CM | POA: Diagnosis not present

## 2020-11-14 DIAGNOSIS — Z20822 Contact with and (suspected) exposure to covid-19: Secondary | ICD-10-CM | POA: Diagnosis not present

## 2020-11-25 DIAGNOSIS — F10139 Alcohol abuse with withdrawal, unspecified: Secondary | ICD-10-CM | POA: Diagnosis not present

## 2020-11-25 DIAGNOSIS — R109 Unspecified abdominal pain: Secondary | ICD-10-CM | POA: Diagnosis not present

## 2020-11-25 DIAGNOSIS — F17200 Nicotine dependence, unspecified, uncomplicated: Secondary | ICD-10-CM | POA: Diagnosis not present

## 2020-11-25 DIAGNOSIS — R251 Tremor, unspecified: Secondary | ICD-10-CM | POA: Diagnosis not present

## 2020-11-25 DIAGNOSIS — R1013 Epigastric pain: Secondary | ICD-10-CM | POA: Diagnosis not present

## 2020-11-25 DIAGNOSIS — Z79899 Other long term (current) drug therapy: Secondary | ICD-10-CM | POA: Diagnosis not present

## 2020-11-25 DIAGNOSIS — R000 Tachycardia, unspecified: Secondary | ICD-10-CM | POA: Diagnosis not present

## 2020-11-25 DIAGNOSIS — Y908 Blood alcohol level of 240 mg/100 ml or more: Secondary | ICD-10-CM | POA: Diagnosis not present

## 2020-11-25 DIAGNOSIS — R112 Nausea with vomiting, unspecified: Secondary | ICD-10-CM | POA: Diagnosis not present

## 2020-11-25 DIAGNOSIS — K7030 Alcoholic cirrhosis of liver without ascites: Secondary | ICD-10-CM | POA: Diagnosis not present

## 2020-11-25 DIAGNOSIS — R1310 Dysphagia, unspecified: Secondary | ICD-10-CM | POA: Diagnosis not present

## 2020-11-25 DIAGNOSIS — R1011 Right upper quadrant pain: Secondary | ICD-10-CM | POA: Diagnosis not present

## 2020-11-25 DIAGNOSIS — F17210 Nicotine dependence, cigarettes, uncomplicated: Secondary | ICD-10-CM | POA: Diagnosis not present

## 2020-11-25 DIAGNOSIS — Z20822 Contact with and (suspected) exposure to covid-19: Secondary | ICD-10-CM | POA: Diagnosis not present

## 2020-11-25 DIAGNOSIS — Z7951 Long term (current) use of inhaled steroids: Secondary | ICD-10-CM | POA: Diagnosis not present

## 2020-11-25 DIAGNOSIS — F10230 Alcohol dependence with withdrawal, uncomplicated: Secondary | ICD-10-CM | POA: Diagnosis not present

## 2020-11-25 DIAGNOSIS — B182 Chronic viral hepatitis C: Secondary | ICD-10-CM | POA: Diagnosis not present

## 2020-11-25 DIAGNOSIS — Z23 Encounter for immunization: Secondary | ICD-10-CM | POA: Diagnosis not present

## 2020-11-25 DIAGNOSIS — K529 Noninfective gastroenteritis and colitis, unspecified: Secondary | ICD-10-CM | POA: Diagnosis not present

## 2020-11-26 DIAGNOSIS — Y908 Blood alcohol level of 240 mg/100 ml or more: Secondary | ICD-10-CM | POA: Diagnosis not present

## 2020-11-26 DIAGNOSIS — F10230 Alcohol dependence with withdrawal, uncomplicated: Secondary | ICD-10-CM | POA: Diagnosis not present

## 2020-11-26 DIAGNOSIS — K7030 Alcoholic cirrhosis of liver without ascites: Secondary | ICD-10-CM | POA: Diagnosis not present

## 2020-11-26 DIAGNOSIS — F10139 Alcohol abuse with withdrawal, unspecified: Secondary | ICD-10-CM | POA: Diagnosis not present

## 2020-11-26 DIAGNOSIS — B182 Chronic viral hepatitis C: Secondary | ICD-10-CM | POA: Diagnosis not present

## 2020-11-26 DIAGNOSIS — F17200 Nicotine dependence, unspecified, uncomplicated: Secondary | ICD-10-CM | POA: Diagnosis not present

## 2020-11-26 DIAGNOSIS — K529 Noninfective gastroenteritis and colitis, unspecified: Secondary | ICD-10-CM | POA: Diagnosis not present

## 2020-11-26 DIAGNOSIS — R1310 Dysphagia, unspecified: Secondary | ICD-10-CM | POA: Diagnosis not present

## 2020-12-19 DIAGNOSIS — K7031 Alcoholic cirrhosis of liver with ascites: Secondary | ICD-10-CM | POA: Diagnosis not present

## 2020-12-19 DIAGNOSIS — B182 Chronic viral hepatitis C: Secondary | ICD-10-CM | POA: Diagnosis not present

## 2020-12-19 DIAGNOSIS — K219 Gastro-esophageal reflux disease without esophagitis: Secondary | ICD-10-CM | POA: Diagnosis not present

## 2021-03-02 DIAGNOSIS — F1321 Sedative, hypnotic or anxiolytic dependence, in remission: Secondary | ICD-10-CM | POA: Diagnosis not present

## 2021-03-02 DIAGNOSIS — M25561 Pain in right knee: Secondary | ICD-10-CM | POA: Diagnosis not present

## 2021-03-02 DIAGNOSIS — F1121 Opioid dependence, in remission: Secondary | ICD-10-CM | POA: Diagnosis not present

## 2021-03-02 DIAGNOSIS — F1220 Cannabis dependence, uncomplicated: Secondary | ICD-10-CM | POA: Diagnosis not present

## 2021-03-02 DIAGNOSIS — F3132 Bipolar disorder, current episode depressed, moderate: Secondary | ICD-10-CM | POA: Diagnosis not present

## 2021-03-02 DIAGNOSIS — F1421 Cocaine dependence, in remission: Secondary | ICD-10-CM | POA: Diagnosis not present

## 2021-03-02 DIAGNOSIS — M25562 Pain in left knee: Secondary | ICD-10-CM | POA: Diagnosis not present

## 2021-03-02 DIAGNOSIS — Z79899 Other long term (current) drug therapy: Secondary | ICD-10-CM | POA: Diagnosis not present

## 2021-03-02 DIAGNOSIS — F1020 Alcohol dependence, uncomplicated: Secondary | ICD-10-CM | POA: Diagnosis not present

## 2021-03-03 DIAGNOSIS — F1020 Alcohol dependence, uncomplicated: Secondary | ICD-10-CM | POA: Diagnosis not present

## 2021-03-03 DIAGNOSIS — F1121 Opioid dependence, in remission: Secondary | ICD-10-CM | POA: Diagnosis not present

## 2021-03-03 DIAGNOSIS — F3132 Bipolar disorder, current episode depressed, moderate: Secondary | ICD-10-CM | POA: Diagnosis not present

## 2021-03-03 DIAGNOSIS — F1220 Cannabis dependence, uncomplicated: Secondary | ICD-10-CM | POA: Diagnosis not present

## 2021-03-08 DIAGNOSIS — F1121 Opioid dependence, in remission: Secondary | ICD-10-CM | POA: Diagnosis not present

## 2021-03-08 DIAGNOSIS — M25561 Pain in right knee: Secondary | ICD-10-CM | POA: Diagnosis not present

## 2021-03-08 DIAGNOSIS — F1421 Cocaine dependence, in remission: Secondary | ICD-10-CM | POA: Diagnosis not present

## 2021-03-08 DIAGNOSIS — F3132 Bipolar disorder, current episode depressed, moderate: Secondary | ICD-10-CM | POA: Diagnosis not present

## 2021-03-08 DIAGNOSIS — M25562 Pain in left knee: Secondary | ICD-10-CM | POA: Diagnosis not present

## 2021-03-08 DIAGNOSIS — F1321 Sedative, hypnotic or anxiolytic dependence, in remission: Secondary | ICD-10-CM | POA: Diagnosis not present

## 2021-03-08 DIAGNOSIS — F1220 Cannabis dependence, uncomplicated: Secondary | ICD-10-CM | POA: Diagnosis not present

## 2021-03-08 DIAGNOSIS — F1020 Alcohol dependence, uncomplicated: Secondary | ICD-10-CM | POA: Diagnosis not present

## 2021-03-10 DIAGNOSIS — F1121 Opioid dependence, in remission: Secondary | ICD-10-CM | POA: Diagnosis not present

## 2021-03-10 DIAGNOSIS — F3132 Bipolar disorder, current episode depressed, moderate: Secondary | ICD-10-CM | POA: Diagnosis not present

## 2021-03-10 DIAGNOSIS — F1020 Alcohol dependence, uncomplicated: Secondary | ICD-10-CM | POA: Diagnosis not present

## 2021-03-10 DIAGNOSIS — F1220 Cannabis dependence, uncomplicated: Secondary | ICD-10-CM | POA: Diagnosis not present

## 2021-03-17 DIAGNOSIS — F1121 Opioid dependence, in remission: Secondary | ICD-10-CM | POA: Diagnosis not present

## 2021-03-17 DIAGNOSIS — F1020 Alcohol dependence, uncomplicated: Secondary | ICD-10-CM | POA: Diagnosis not present

## 2021-03-17 DIAGNOSIS — F3132 Bipolar disorder, current episode depressed, moderate: Secondary | ICD-10-CM | POA: Diagnosis not present

## 2021-03-17 DIAGNOSIS — F1220 Cannabis dependence, uncomplicated: Secondary | ICD-10-CM | POA: Diagnosis not present

## 2021-03-19 DIAGNOSIS — F1121 Opioid dependence, in remission: Secondary | ICD-10-CM | POA: Diagnosis not present

## 2021-03-19 DIAGNOSIS — F3132 Bipolar disorder, current episode depressed, moderate: Secondary | ICD-10-CM | POA: Diagnosis not present

## 2021-03-19 DIAGNOSIS — F1220 Cannabis dependence, uncomplicated: Secondary | ICD-10-CM | POA: Diagnosis not present

## 2021-03-19 DIAGNOSIS — F1020 Alcohol dependence, uncomplicated: Secondary | ICD-10-CM | POA: Diagnosis not present

## 2021-03-21 DIAGNOSIS — F1020 Alcohol dependence, uncomplicated: Secondary | ICD-10-CM | POA: Diagnosis not present

## 2021-03-21 DIAGNOSIS — F1121 Opioid dependence, in remission: Secondary | ICD-10-CM | POA: Diagnosis not present

## 2021-03-21 DIAGNOSIS — F3132 Bipolar disorder, current episode depressed, moderate: Secondary | ICD-10-CM | POA: Diagnosis not present

## 2021-03-21 DIAGNOSIS — F1220 Cannabis dependence, uncomplicated: Secondary | ICD-10-CM | POA: Diagnosis not present

## 2021-03-22 DIAGNOSIS — F3132 Bipolar disorder, current episode depressed, moderate: Secondary | ICD-10-CM | POA: Diagnosis not present

## 2021-03-22 DIAGNOSIS — F1220 Cannabis dependence, uncomplicated: Secondary | ICD-10-CM | POA: Diagnosis not present

## 2021-03-22 DIAGNOSIS — F1020 Alcohol dependence, uncomplicated: Secondary | ICD-10-CM | POA: Diagnosis not present

## 2021-03-22 DIAGNOSIS — F1121 Opioid dependence, in remission: Secondary | ICD-10-CM | POA: Diagnosis not present

## 2021-03-24 DIAGNOSIS — Z79899 Other long term (current) drug therapy: Secondary | ICD-10-CM | POA: Diagnosis not present

## 2021-03-24 DIAGNOSIS — F1220 Cannabis dependence, uncomplicated: Secondary | ICD-10-CM | POA: Diagnosis not present

## 2021-03-24 DIAGNOSIS — F1121 Opioid dependence, in remission: Secondary | ICD-10-CM | POA: Diagnosis not present

## 2021-03-24 DIAGNOSIS — F3132 Bipolar disorder, current episode depressed, moderate: Secondary | ICD-10-CM | POA: Diagnosis not present

## 2021-03-24 DIAGNOSIS — F1020 Alcohol dependence, uncomplicated: Secondary | ICD-10-CM | POA: Diagnosis not present

## 2021-03-25 DIAGNOSIS — F1220 Cannabis dependence, uncomplicated: Secondary | ICD-10-CM | POA: Diagnosis not present

## 2021-03-25 DIAGNOSIS — F1020 Alcohol dependence, uncomplicated: Secondary | ICD-10-CM | POA: Diagnosis not present

## 2021-03-25 DIAGNOSIS — F1121 Opioid dependence, in remission: Secondary | ICD-10-CM | POA: Diagnosis not present

## 2021-03-25 DIAGNOSIS — F3132 Bipolar disorder, current episode depressed, moderate: Secondary | ICD-10-CM | POA: Diagnosis not present

## 2021-03-26 DIAGNOSIS — F1020 Alcohol dependence, uncomplicated: Secondary | ICD-10-CM | POA: Diagnosis not present

## 2021-03-26 DIAGNOSIS — F1121 Opioid dependence, in remission: Secondary | ICD-10-CM | POA: Diagnosis not present

## 2021-03-26 DIAGNOSIS — F1220 Cannabis dependence, uncomplicated: Secondary | ICD-10-CM | POA: Diagnosis not present

## 2021-03-26 DIAGNOSIS — F3132 Bipolar disorder, current episode depressed, moderate: Secondary | ICD-10-CM | POA: Diagnosis not present

## 2021-04-07 DIAGNOSIS — F3132 Bipolar disorder, current episode depressed, moderate: Secondary | ICD-10-CM | POA: Diagnosis not present

## 2021-04-07 DIAGNOSIS — F1121 Opioid dependence, in remission: Secondary | ICD-10-CM | POA: Diagnosis not present

## 2021-04-07 DIAGNOSIS — Z79899 Other long term (current) drug therapy: Secondary | ICD-10-CM | POA: Diagnosis not present

## 2021-04-07 DIAGNOSIS — F1220 Cannabis dependence, uncomplicated: Secondary | ICD-10-CM | POA: Diagnosis not present

## 2021-10-14 IMAGING — US US ABDOMEN COMPLETE W/ ELASTOGRAPHY
2 series · 12 of 25 positions shown · non-contrast
Comparison: None

CLINICAL DATA: Hepatitis C, alcoholic cirrhosis, smoker

EXAM:
ULTRASOUND ABDOMEN
ULTRASOUND HEPATIC ELASTOGRAPHY
TECHNIQUE: Sonography of the upper abdomen was performed. In addition,
ultrasound elastography evaluation of the liver was performed. A
region of interest was placed within the right lobe of the liver.
Following application of a compressive sonographic pulse, tissue
compressibility was assessed. Multiple assessments were performed at
the selected site. Median tissue compressibility was determined.
Previously, hepatic stiffness was assessed by shear wave velocity.
Based on recently published Society of Radiologists in Ultrasound
consensus article, reporting is now recommended to be performed in
the SI units of pressure (kiloPascals) representing hepatic
stiffness/elasticity. The obtained result is compared to the
published reference standards. (cACLD = compensated Advanced Chronic
Liver Disease)

[Series 1: us abdomen complete w/ elastography · 11 of 93 slices shown (1 of 2)]
[im 5/93]
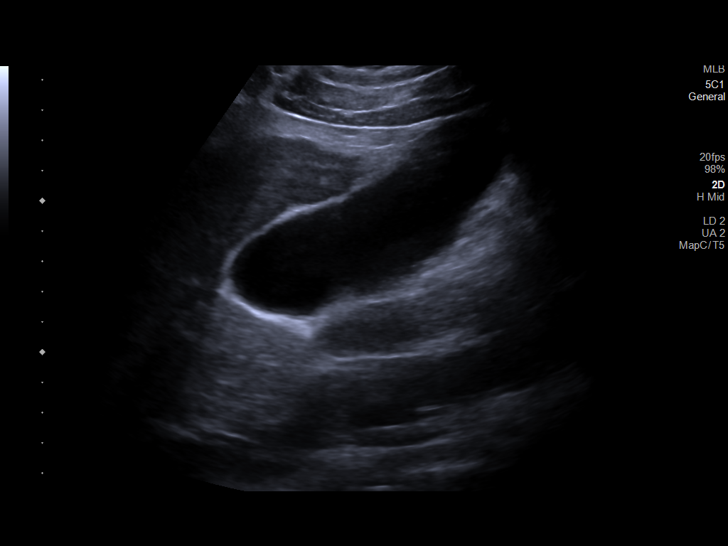
[im 14/93]
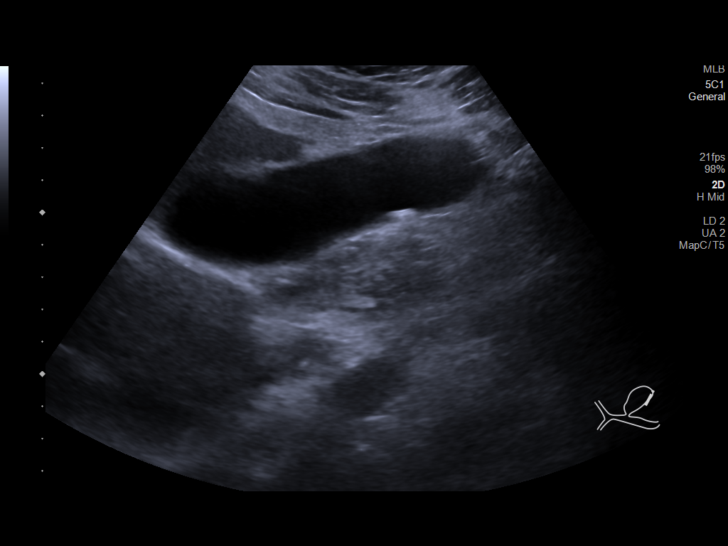
[im 22/93]
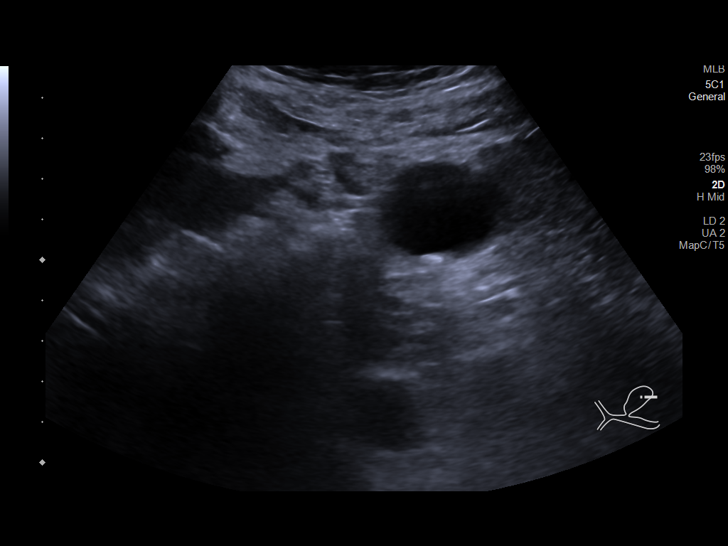
[im 31/93]
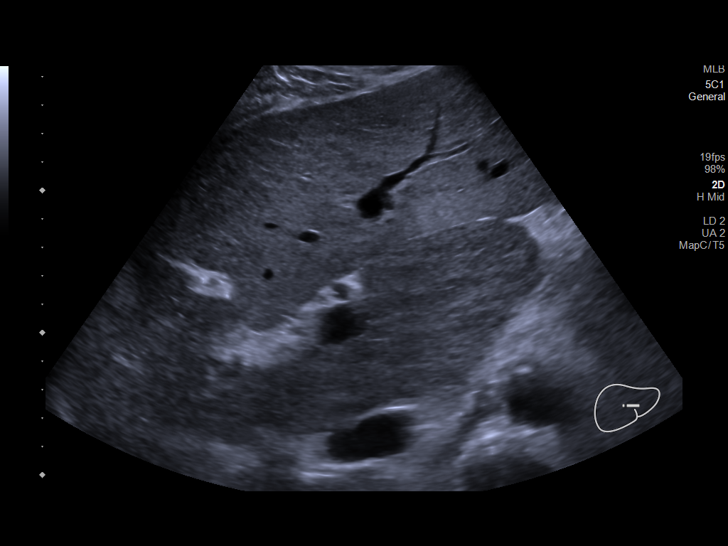
[im 40/93]
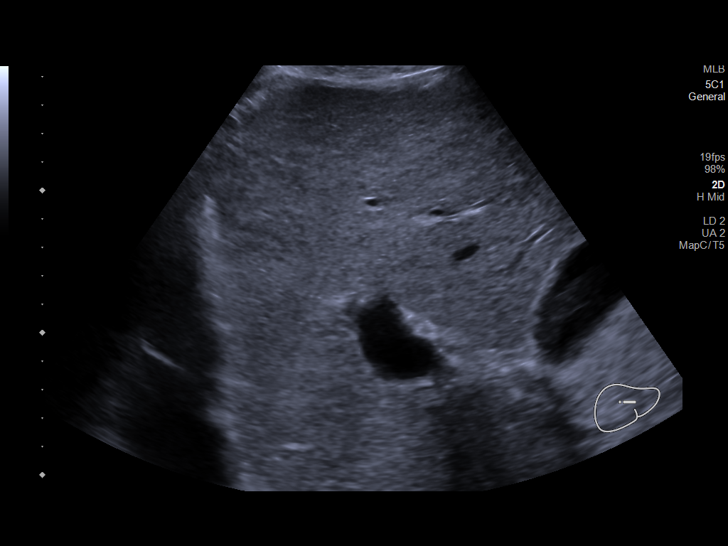
[im 49/93]
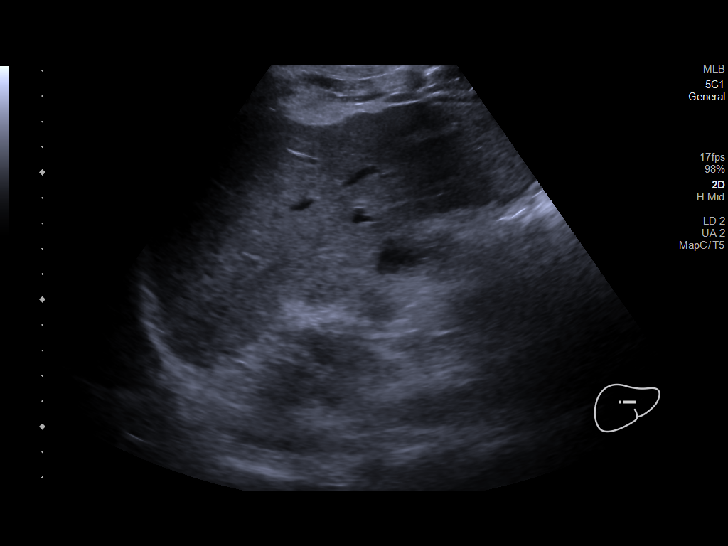
[im 57/93]
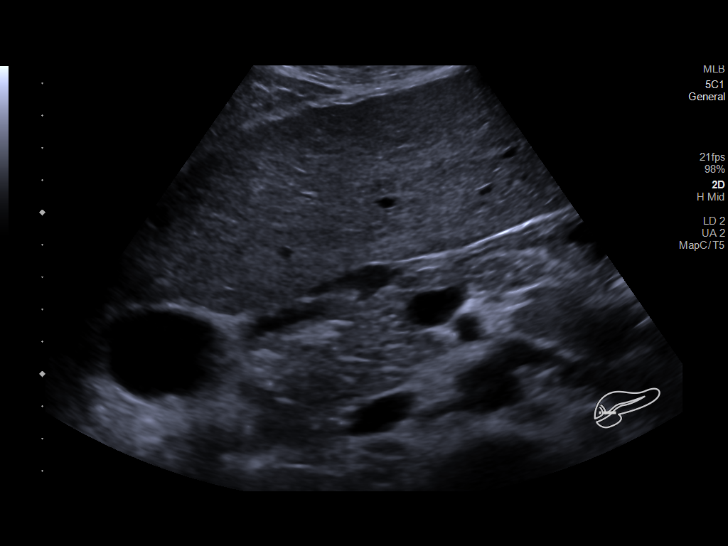
[im 66/93]
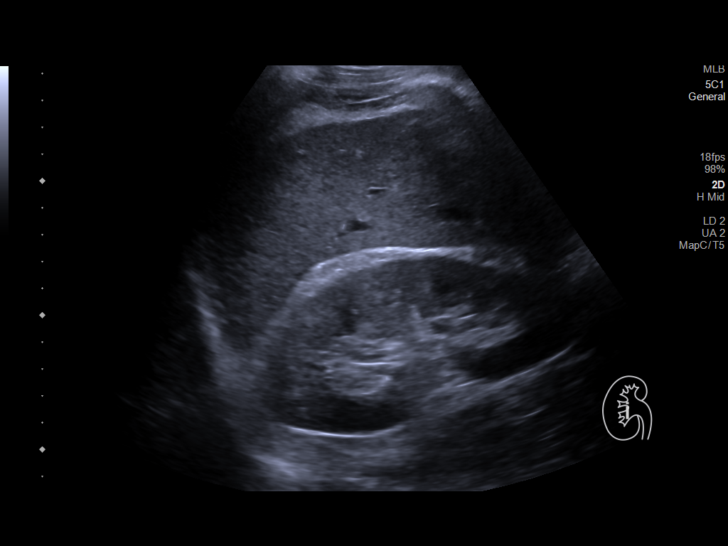
[im 75/93]
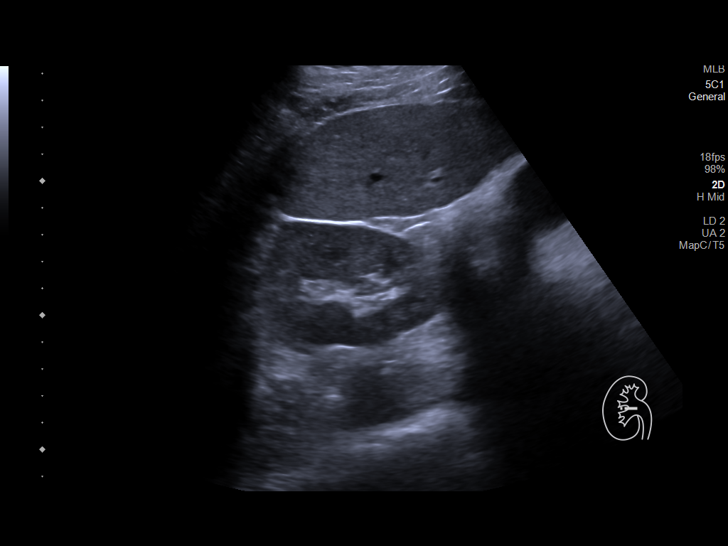
[im 84/93]
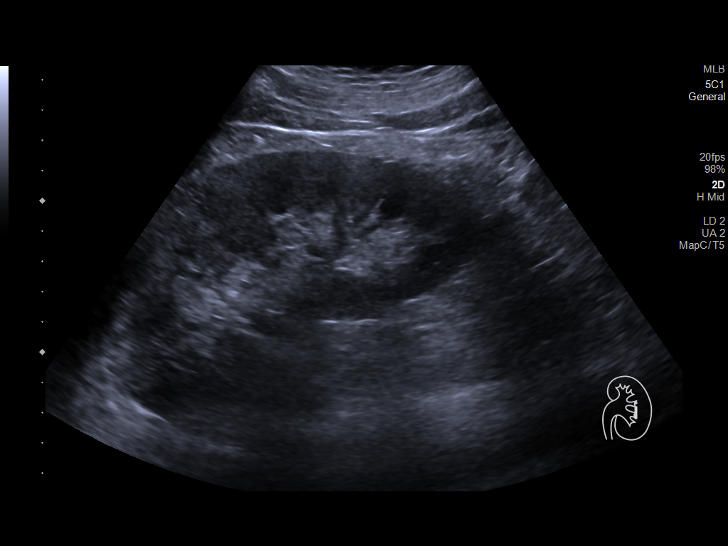
[im 93/93]
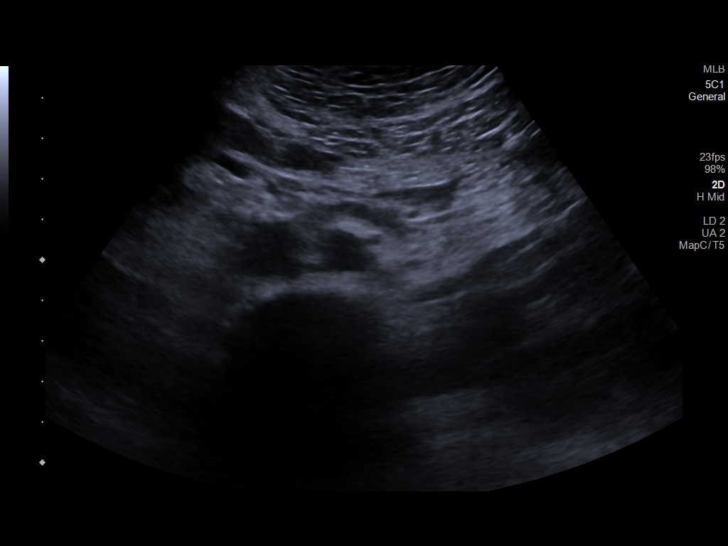

[Series 2: us abdomen complete w/ elastography · 1 of 13 slices shown (2 of 2)]
[im 7/13]
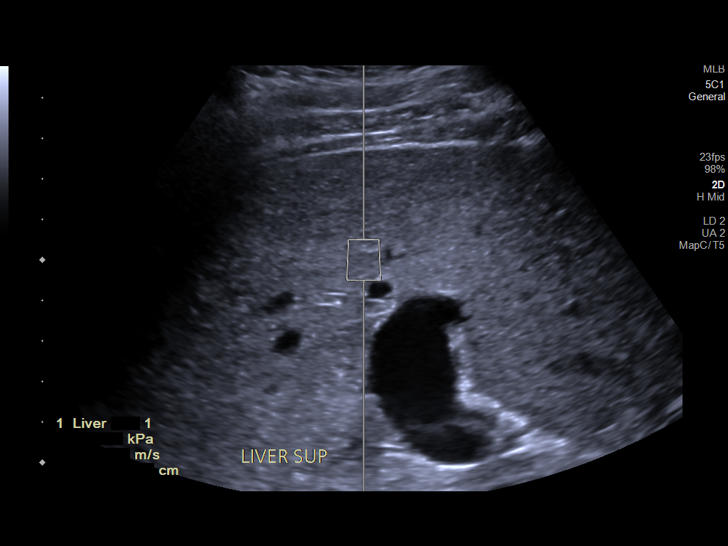

[12 of 25 positions shown; findings below may reference images not displayed]

FINDINGS: ULTRASOUND ABDOMEN

Gallbladder: At small dependent calculi up to 7 mm diameter. No
gallbladder wall thickening, pericholecystic fluid or sonographic
Murphy sign.

Common bile duct: Diameter: 4 mm, normal

Liver: Coarsened increased echogenicity of liver with nodular
contours consistent with cirrhosis. No focal hepatic mass lesion.
Portal vein is patent on color Doppler imaging with normal direction
of blood flow towards the liver.

IVC: Normal appearance

Pancreas: Normal appearance

Spleen: Appears enlarged, 13.3 cm length with a calculated volume of
564 mL. No focal mass.

Right Kidney: Length: 11.7 cm. Normal morphology without mass or
hydronephrosis.

Left Kidney: Length: At 13.2 cm. Normal morphology without mass or
hydronephrosis.

Abdominal aorta: Normal caliber

Other findings: No free fluid

ULTRASOUND HEPATIC ELASTOGRAPHY

Device: Siemens Helix VTQ

Patient position: Supine

Transducer 5C1

Number of measurements: 10

Hepatic segment:  8

Median kPa:

IQR:

IQR/Median kPa ratio:

Data quality:  Good

Diagnostic category: > or =17 kPa: highly suggestive of cACLD with
an increased probability of clinically significant portal
hypertension
IMPRESSION: ULTRASOUND ABDOMEN:

Cirrhotic appearing liver without mass.

Mild splenomegaly.

ULTRASOUND HEPATIC ELASTOGRAPHY:

Median kPa:

Diagnostic category: > or =17 kPa: highly suggestive of cACLD with
an increased probability of clinically significant portal
hypertension

The use of hepatic elastography is applicable to patients with viral
hepatitis and non-alcoholic fatty liver disease. At this time, there
is insufficient data for the referenced cut-off values and use in
other causes of liver disease, including alcoholic liver disease.
Patients, however, may be assessed by elastography and serve as
their own reference standard/baseline.

In patients with non-alcoholic liver disease, the values suggesting
compensated advanced chronic liver disease (cACLD) may be lower, and
patients may need additional testing with elasticity results of [DATE]
kPa.

Please note that abnormal hepatic elasticity and shear wave
velocities may also be identified in clinical settings other than
with hepatic fibrosis, such as: acute hepatitis, elevated right
heart and central venous pressures including use of beta blockers,
Dannawi disease ([place]), infiltrative processes such as
mastocytosis/amyloidosis/infiltrative tumor/lymphoma, extrahepatic
cholestasis, with hyperemia in the post-prandial state, and with
liver transplantation. Correlation with patient history, laboratory
data, and clinical condition recommended.

Diagnostic Categories:

< or =5 kPa: high probability of being normal

< or =9 kPa: in the absence of other known clinical signs, rules [DATE] kPa and ?13 kPa: suggestive of cACLD, but needs further testing

>13 kPa: highly suggestive of cACLD

> or =17 kPa: highly suggestive of cACLD with an increased
probability of clinically significant portal hypertension

## 2023-03-26 ENCOUNTER — Other Ambulatory Visit: Payer: Self-pay
